# Patient Record
Sex: Male | Born: 1985 | Race: White | Hispanic: No | Marital: Single | Smoking: Never smoker
Health system: Southern US, Community
[De-identification: ages and names within clinical notes are randomized; demographics above are authoritative.]

---
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
## 2003-02-25 ENCOUNTER — Encounter: Payer: Self-pay | Admitting: Emergency Medicine

## 2003-02-25 ENCOUNTER — Encounter (INDEPENDENT_AMBULATORY_CARE_PROVIDER_SITE_OTHER): Payer: Self-pay

## 2003-02-25 ENCOUNTER — Observation Stay (HOSPITAL_COMMUNITY): Admission: EM | Admit: 2003-02-25 | Discharge: 2003-02-26 | Payer: Self-pay | Admitting: Emergency Medicine

## 2005-02-16 ENCOUNTER — Ambulatory Visit (HOSPITAL_COMMUNITY): Admission: RE | Admit: 2005-02-16 | Discharge: 2005-02-16 | Payer: Self-pay | Admitting: Orthopedic Surgery

## 2005-02-16 ENCOUNTER — Ambulatory Visit (HOSPITAL_BASED_OUTPATIENT_CLINIC_OR_DEPARTMENT_OTHER): Admission: RE | Admit: 2005-02-16 | Discharge: 2005-02-16 | Payer: Self-pay | Admitting: Orthopedic Surgery

## 2006-12-02 ENCOUNTER — Encounter: Admission: RE | Admit: 2006-12-02 | Discharge: 2006-12-02 | Payer: Self-pay | Admitting: Internal Medicine

## 2007-10-10 ENCOUNTER — Emergency Department (HOSPITAL_COMMUNITY): Admission: EM | Admit: 2007-10-10 | Discharge: 2007-10-10 | Payer: Self-pay | Admitting: Emergency Medicine

## 2008-02-19 ENCOUNTER — Emergency Department (HOSPITAL_COMMUNITY): Admission: EM | Admit: 2008-02-19 | Discharge: 2008-02-19 | Payer: Self-pay | Admitting: Emergency Medicine

## 2009-01-28 IMAGING — CR DG ELBOW COMPLETE 3+V*L*
4 series · 4 of 4 positions shown · non-contrast
Comparison: None

CLINICAL DATA: The patient fell.  Pain.

LEFT ELBOW - COMPLETE 3+ VIEW

[x elbow joint ap left]
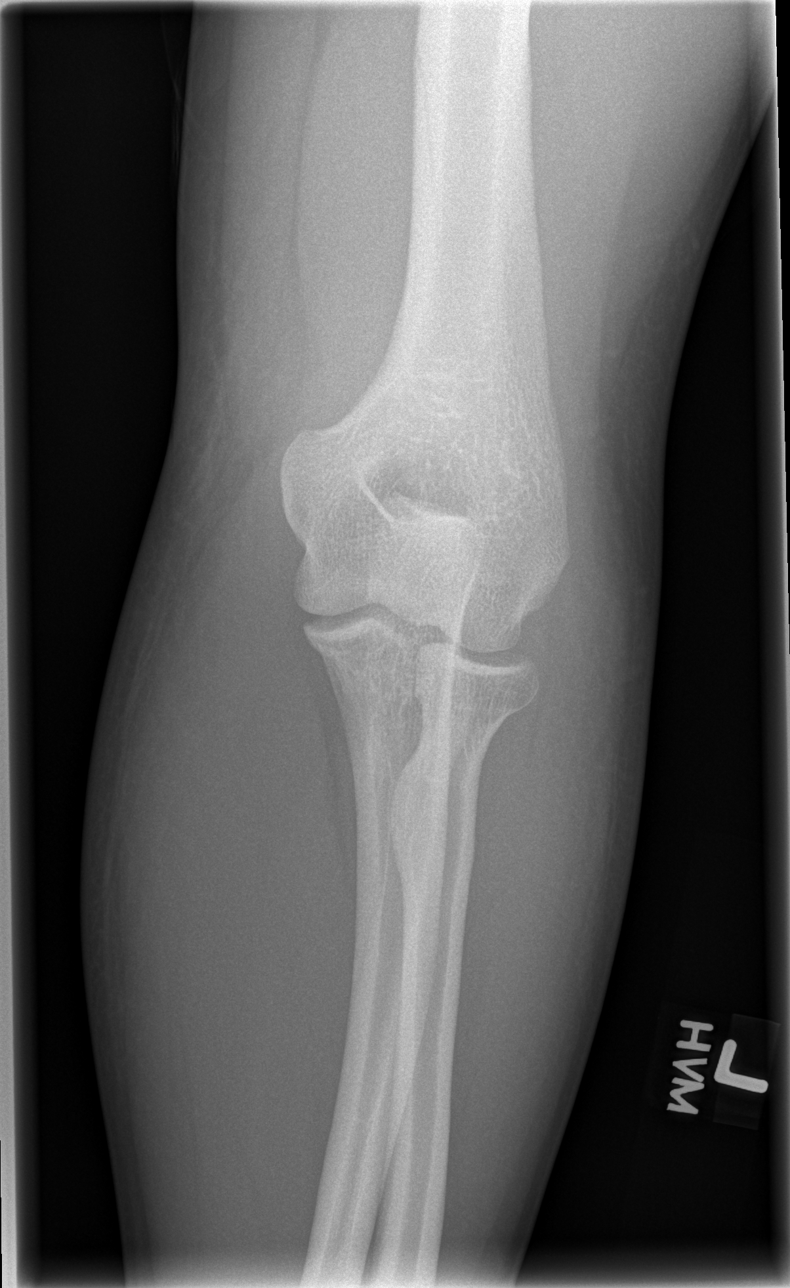

[x elbow joint obl. left (1 of 2)]
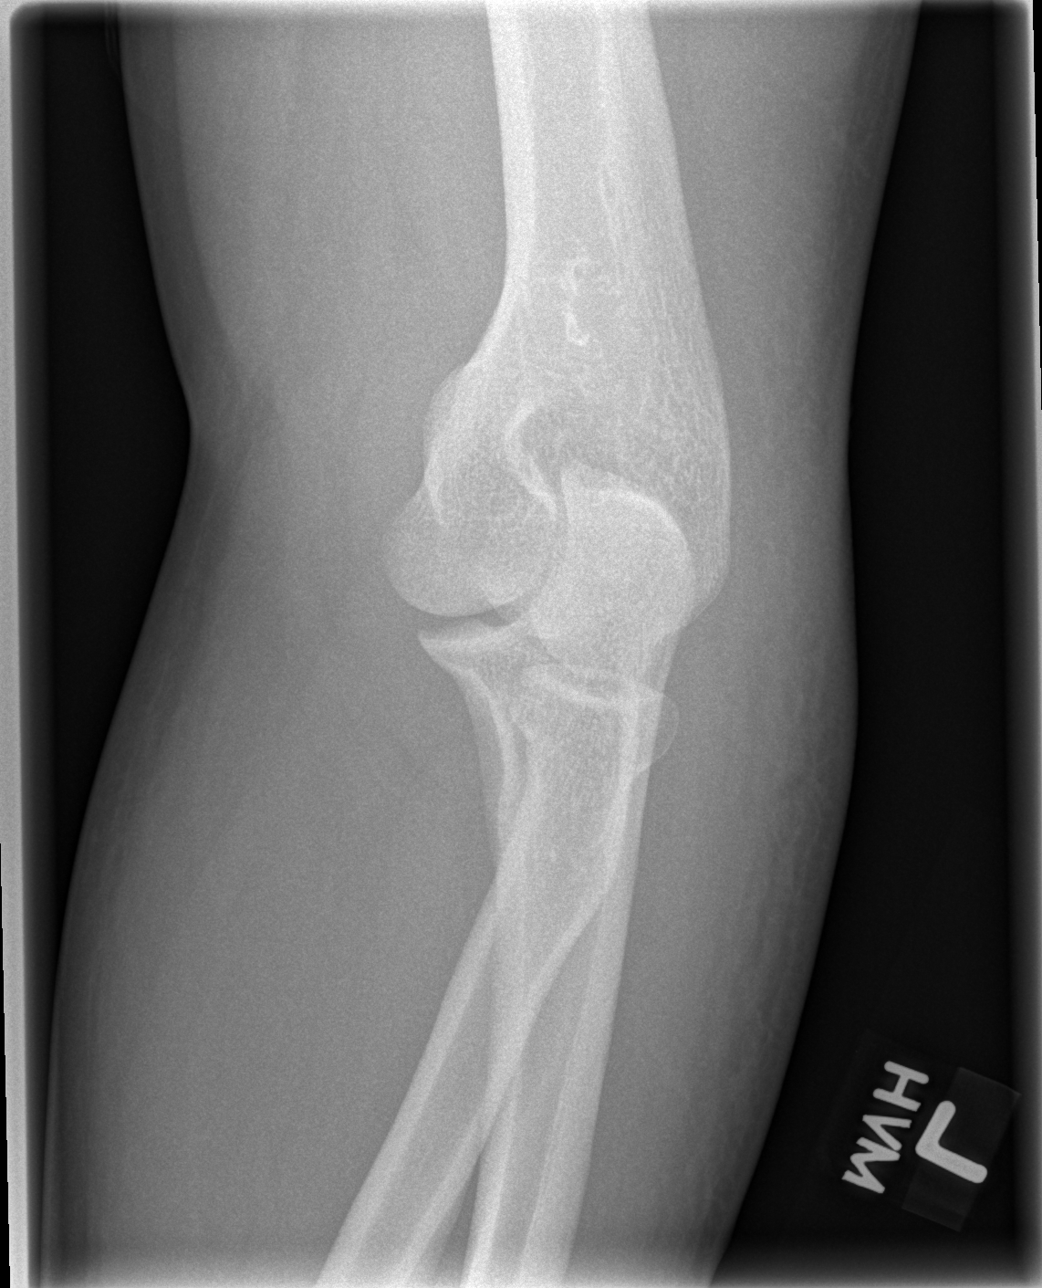

[x elbow joint obl. left (2 of 2)]
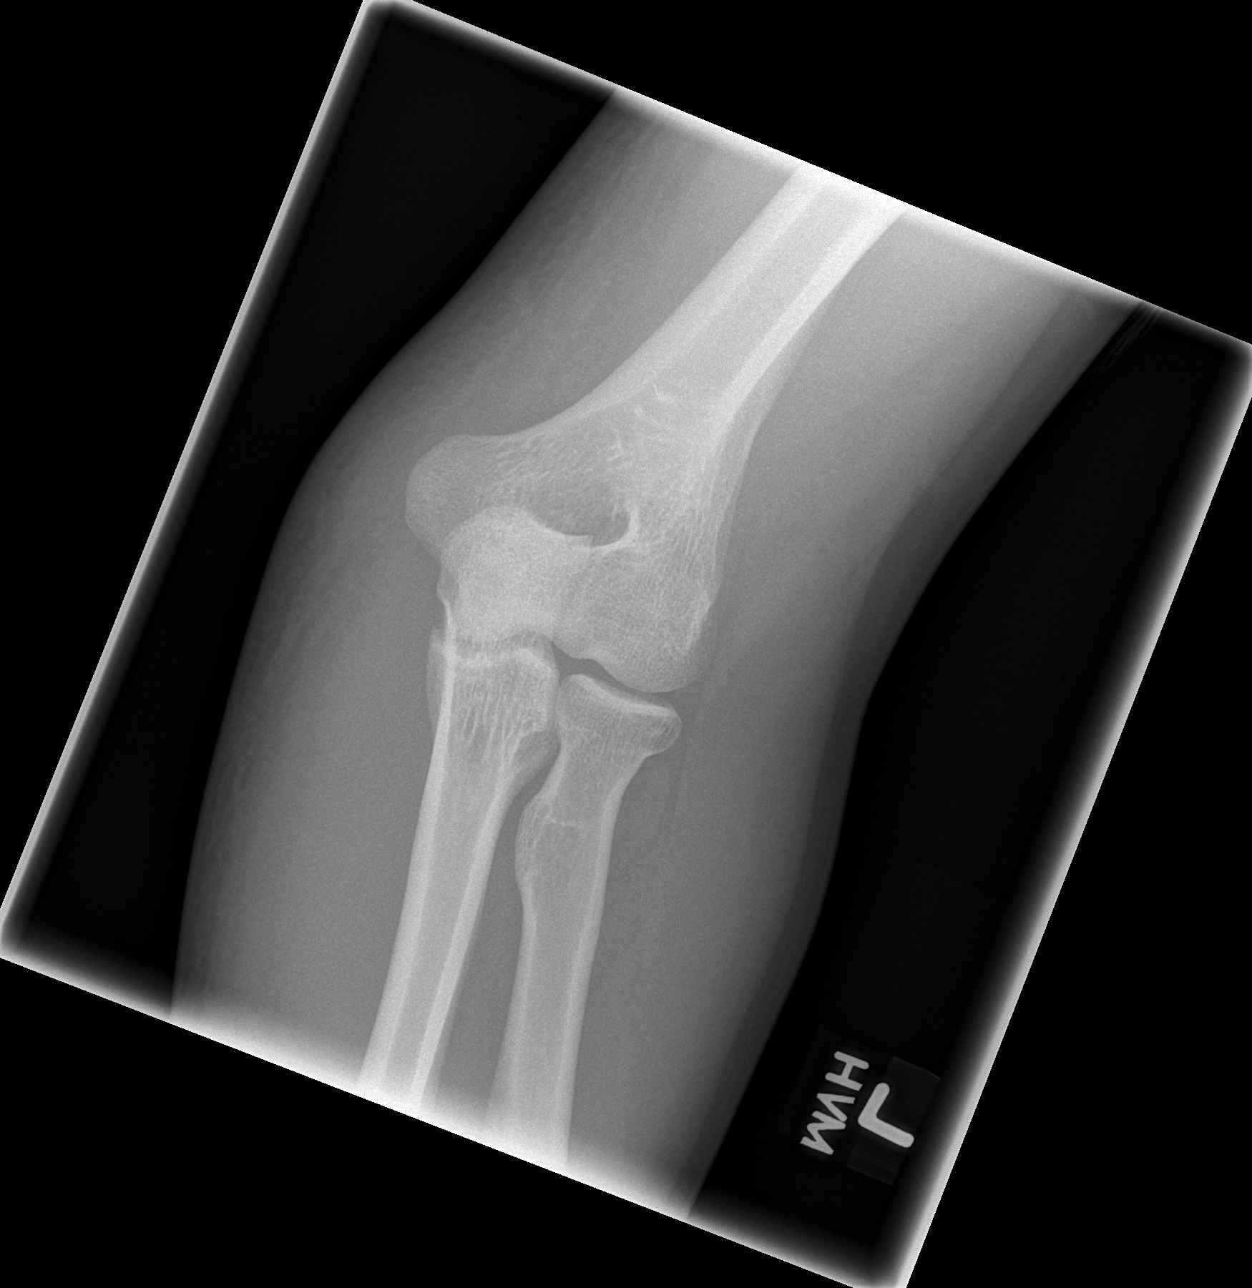

[x elbow joint lat left]
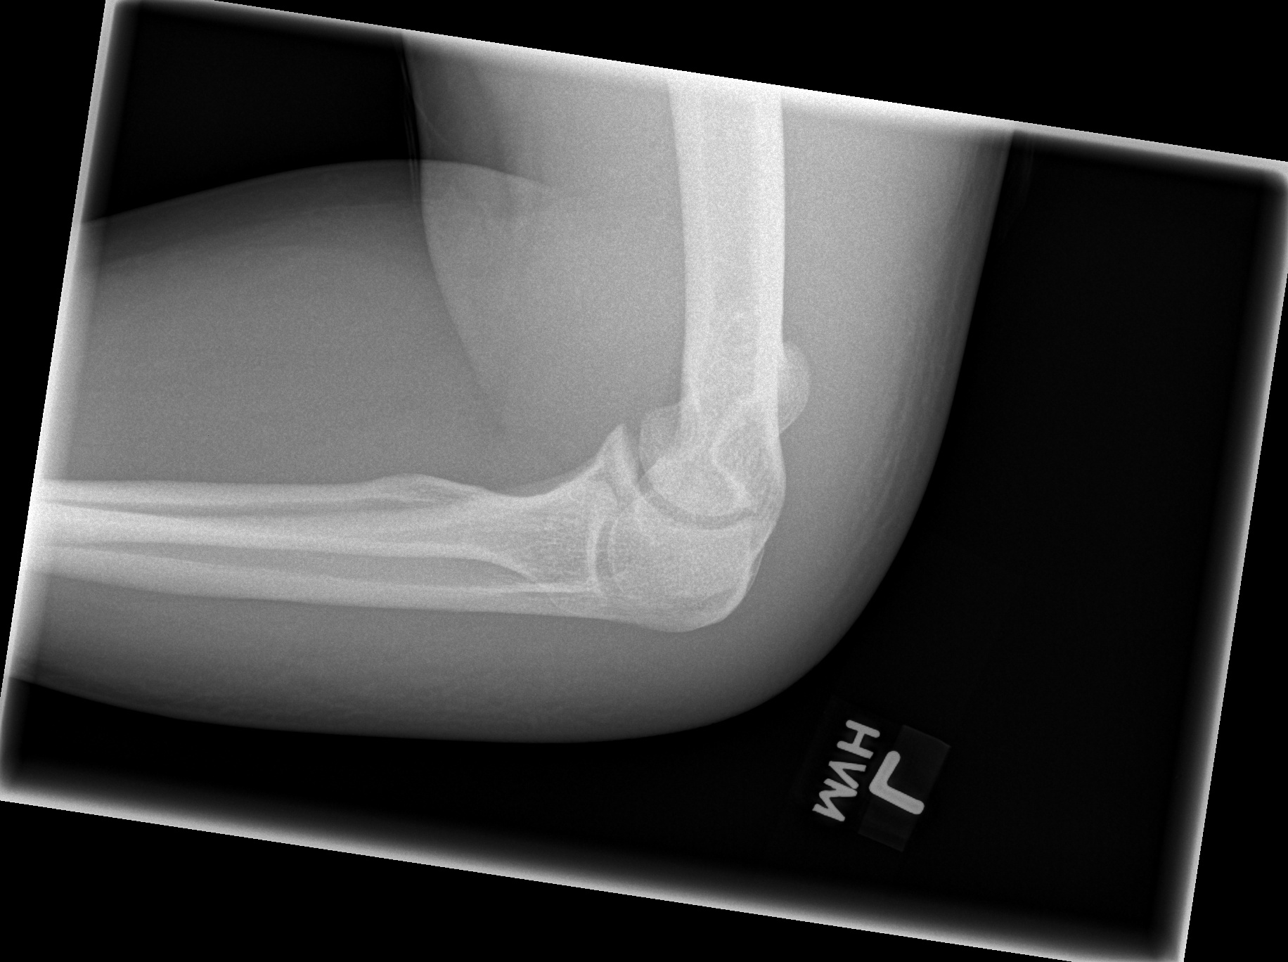

[4 of 4 positions shown; findings below may reference images not displayed]

FINDINGS: No fracture, subluxation, or joint effusion. Soft tissue
swelling particularly posteriorly.
IMPRESSION: No acute bony findings.  No joint effusion.

## 2010-12-05 NOTE — Op Note (Signed)
NAME:  Wayne Blake, Wayne Blake NO.:  [id number]   MEDICAL RECORD NO.:  [id number]          PATIENT TYPE:  AMB   LOCATION:  DSC                          FACILITY:  [facility]   PHYSICIAN:  Cindee Salt, M.D.       DATE OF BIRTH:  [birth year]   DATE OF PROCEDURE:  02/16/2005  DATE OF DISCHARGE:                                 OPERATIVE REPORT   PREOPERATIVE DIAGNOSIS:  Fracture middle phalanx, left middle finger.   POSTOPERATIVE DIAGNOSIS:  Fracture middle phalanx, left middle finger.  Open  reduction internal fixation, left middle finger middle phalanx.   SURGEON:  Kuzma.   ASSISTANT:  Carolyne Fiscal R.N.   ANESTHESIA:  Axillary block.   HISTORY:  The patient is a 25 year old male who suffered a fracture of his  left middle finger on a rope swing. He is referred from Dr. Thurston Hole. X-rays  reveal a rotated fracture of the middle phalanx, long oblique in nature with  displacement.   PROCEDURE:  The patient is brought to the operating room where an axillary  block was carried out without difficulty.  He was prepped using DuraPrep,  supine position, left arm free. A hockey-stick incision was made over the  radial aspect of the middle phalanx, carried across the distal  interphalangeal joint, carried down through subcutaneous tissue.  The  incision was made on the radial aspect to the radial side of the extensor  tendon which was then dissected free from the underlying periosteum.  Periosteum was incised on the radial aspect dorsally to allow visualization  of the fracture.  The volar soft tissue was left intact. The fracture was  long oblique, was noted to be one condyle with the remaining condyle intact.  This had some difficulty in reduction due to the traction of the extensor  for visualization of reduction, but reduction was performed and pinned with  a 0.081 K-wire. X-rays confirmed positioning.  Three screws were then placed  measuring 12, 10 and 12 mm obliquely across the fracture.   A moderate amount  of difficulty was obtained in that the 12 mm screws were not the proper head  size to accept the screwdriver and new screws had to be sterilized.  X-rays  AP lateral direction revealed the fracture well aligned. No angulation  rotation was noted with flexion-extension.  The wound was irrigated. The  periosteum closed with figure-of-eight 6-0 chromic sutures and skin with  interrupted 5-0 nylon sutures. Each of the drills were overdrilled  proximally and  countersunk to minimize head displacement. The patient tolerated the  procedure well and was taken to the recovery for observation in satisfactory  condition. He is discharged home to return to the [facility]  in one week on Vicodin.       GK/MEDQ  D:  02/16/2005  T:  02/16/2005  Job:  [id number]   cc:   Molly Maduro A. Thurston Hole, M.D.  [address]  [place] [address]  Fax: [phone]

## 2010-12-05 NOTE — Op Note (Signed)
NAME:  Wayne Blake, Wayne Blake NO.:  [id number]   MEDICAL RECORD NO.:  [id number]                   PATIENT TYPE:  OBV   LOCATION:  0103                                 FACILITY:  [facility]   PHYSICIAN:  Lorre Munroe., M.D.            DATE OF BIRTH:  1985-12-04   DATE OF PROCEDURE:  02/25/2003  DATE OF DISCHARGE:                                 OPERATIVE REPORT   PREOPERATIVE DIAGNOSIS:  Acute appendicitis.   POSTOPERATIVE DIAGNOSIS:  Acute appendicitis.   OPERATION:  Laparoscopic appendectomy.   SURGEON:  Lebron Conners, M.D.   ANESTHESIA:  General.   DESCRIPTION OF PROCEDURE:  After the patient was monitored and anesthetized  and had a bladder catheter in place and routine preparation and draping of  the abdomen, I made a 2 cm transverse incision just below the umbilicus  through a spot which I anesthetized with long-acting local anesthetic.  I  dissected down to the midline fascia and opened it in the midline  longitudinally, then bluntly entered the peritoneum.  I secured a Hasson  cannula with a 0 Vicryl pursestring suture in the fascia and inflated the  abdomen with CO2.  I put in the camera and noted inflammation in the right  lower quadrant as expected.  I then placed a 5 mm right upper quadrant port  and an 11 mm port just to the left of the lower midline under direct vision  and felt that they went in without injuring the bowel.  I then placed the  patient head-down, foot-up, and tilted to the left, and pulled the small  intestines away from the area of the cecum.  I saw an inflamed appendix,  grasped it and elevated it, and took down adhesions to the pelvic sidewalls  and to the terminal ileum and cecum.  I used cautery for this dissection.  I  thinned down the bulky mesoappendix somewhat, then attempted to staple the  appendix and meso with one firing of the stapler but it would not close  properly in that way, so I first stapled the  mesoappendix and then stapled  the appendix at its base as it joined the cecum.  It came off nicely, and  hemostasis was good.  I placed the appendix in a plastic pouch and retrieved  it through the umbilical incision and tied the pursestring suture.  I then  removed the right upper quadrant port under direct vision and removed the  lower midline port after allowing the CO2 to escape.  I closed all the skin  incisions with intracuticular 4-0 Vicryl and Steri-Strips.  The patient was  stable throughout.                                               Lorre Munroe., M.D.  WB/MEDQ  D:  02/25/2003  T:  02/26/2003  Job:  [id number]

## 2011-04-13 LAB — RAPID URINE DRUG SCREEN, HOSP PERFORMED
Amphetamines: NOT DETECTED
Barbiturates: NOT DETECTED
Benzodiazepines: NOT DETECTED
Cocaine: NOT DETECTED
Opiates: NOT DETECTED
Tetrahydrocannabinol: NOT DETECTED

## 2011-04-13 LAB — DIFFERENTIAL
Basophils Absolute: 0.1
Eosinophils Relative: 0
Lymphocytes Relative: 13
Lymphs Abs: 1.5
Neutro Abs: 10.1 — ABNORMAL HIGH

## 2011-04-13 LAB — COMPREHENSIVE METABOLIC PANEL
Alkaline Phosphatase: 61
BUN: 8
CO2: 27
Chloride: 105
Creatinine, Ser: 0.79
GFR calc non Af Amer: >60
Glucose, Bld: 104 — ABNORMAL HIGH
Potassium: 3.6
Total Bilirubin: 1.3 — ABNORMAL HIGH

## 2011-04-13 LAB — CBC
HCT: 43.6
Hemoglobin: 14.7
Platelets: 367
RDW: 12.5
WBC: 12.3 — ABNORMAL HIGH

## 2011-04-13 LAB — BASIC METABOLIC PANEL
BUN: 8
Calcium: 9.2
GFR calc non Af Amer: >60
Potassium: 3.9
Sodium: 140

## 2017-01-13 ENCOUNTER — Ambulatory Visit (INDEPENDENT_AMBULATORY_CARE_PROVIDER_SITE_OTHER): Payer: Self-pay | Admitting: Emergency Medicine

## 2017-01-13 ENCOUNTER — Encounter: Payer: Self-pay | Admitting: Emergency Medicine

## 2017-01-13 VITALS — BP 142/83 | HR 85 | Temp 98.3°F | Resp 17 | Ht 71.5 in | Wt 261.0 lb

## 2017-01-13 DIAGNOSIS — H6122 Impacted cerumen, left ear: Secondary | ICD-10-CM

## 2017-01-13 NOTE — Patient Instructions (Addendum)
   IF you received an x-ray today, you will receive an invoice from [facility]. Please contact  Radiology at [phone] with questions or concerns regarding your invoice.   IF you received labwork today, you will receive an invoice from [facility]. Please contact [facility] at [phone] with questions or concerns regarding your invoice.   Our billing staff will not be able to assist you with questions regarding bills from these companies.  You will be contacted with the lab results as soon as they are available. The fastest way to get your results is to activate your My Chart account. Instructions are located on the last page of this paperwork. If you have not heard from us regarding the results in 2 weeks, please contact this office.     Earwax Buildup, Adult The ears produce a substance called earwax that helps keep bacteria out of the ear and protects the skin in the ear canal. Occasionally, earwax can build up in the ear and cause discomfort or hearing loss. What increases the risk? This condition is more likely to develop in people who:  Are male.  Are elderly.  Naturally produce more earwax.  Clean their ears often with cotton swabs.  Use earplugs often.  Use in-ear headphones often.  Wear hearing aids.  Have narrow ear canals.  Have earwax that is overly thick or sticky.  Have eczema.  Are dehydrated.  Have excess hair in the ear canal.  What are the signs or symptoms? Symptoms of this condition include:  Reduced or muffled hearing.  A feeling of fullness in the ear or feeling that the ear is plugged.  Fluid coming from the ear.  Ear pain.  Ear itch.  Ringing in the ear.  Coughing.  An obvious piece of earwax that can be seen inside the ear canal.  How is this diagnosed? This condition may be diagnosed based on:  Your symptoms.  Your medical history.  An ear exam. During the exam, your health care provider will look  into your ear with an instrument called an otoscope.  You may have tests, including a hearing test. How is this treated? This condition may be treated by:  Using ear drops to soften the earwax.  Having the earwax removed by a health care provider. The health care provider may: ? Flush the ear with water. ? Use an instrument that has a loop on the end (curette). ? Use a suction device.  Surgery to remove the wax buildup. This may be done in severe cases.  Follow these instructions at home:  Take over-the-counter and prescription medicines only as told by your health care provider.  Do not put any objects, including cotton swabs, into your ear. You can clean the opening of your ear canal with a washcloth or facial tissue.  Follow instructions from your health care provider about cleaning your ears. Do not over-clean your ears.  Drink enough fluid to keep your urine clear or pale yellow. This will help to thin the earwax.  Keep all follow-up visits as told by your health care provider. If earwax builds up in your ears often or if you use hearing aids, consider seeing your health care provider for routine, preventive ear cleanings. Ask your health care provider how often you should schedule your cleanings.  If you have hearing aids, clean them according to instructions from the manufacturer and your health care provider. Contact a health care provider if:  You have ear pain.  You develop   a fever.  You have blood, pus, or other fluid coming from your ear.  You have hearing loss.  You have ringing in your ears that does not go away.  Your symptoms do not improve with treatment.  You feel like the room is spinning (vertigo). Summary  Earwax can build up in the ear and cause discomfort or hearing loss.  The most common symptoms of this condition include reduced or muffled hearing and a feeling of fullness in the ear or feeling that the ear is plugged.  This condition may be  diagnosed based on your symptoms, your medical history, and an ear exam.  This condition may be treated by using ear drops to soften the earwax or by having the earwax removed by a health care provider.  Do not put any objects, including cotton swabs, into your ear. You can clean the opening of your ear canal with a washcloth or facial tissue. This information is not intended to replace advice given to you by your health care provider. Make sure you discuss any questions you have with your health care provider. Document Released: 08/13/2004 Document Revised: 09/16/2016 Document Reviewed: 09/16/2016 Elsevier Interactive Patient Education  2018 [employer] Inc[employer]  

## 2017-01-13 NOTE — Progress Notes (Signed)
Wayne Blake 31 y.o.   Chief Complaint  Patient presents with  . Cerumen Impaction    HISTORY OF PRESENT ILLNESS: This is a 31 y.o. male complaining of cerumen impaction of left ear.  HPI   Prior to Admission medications   Not on File    Not on File  There are no active problems to display for this patient.   No past medical history on file.  No past surgical history on file.  Social History   Social History  . Marital status: Single    Spouse name: N/A  . Number of children: N/A  . Years of education: N/A   Occupational History  . Not on file.   Social History Main Topics  . Smoking status: Never Smoker  . Smokeless tobacco: Never Used  . Alcohol use No  . Drug use: Unknown  . Sexual activity: No   Other Topics Concern  . Not on file   Social History Narrative  . No narrative on file    No family history on file.   Review of Systems  Constitutional: Negative.  Negative for chills and fever.  HENT: Positive for hearing loss.   Respiratory: Negative.  Negative for shortness of breath.   Cardiovascular: Negative for chest pain.  Gastrointestinal: Negative for nausea and vomiting.  Skin: Negative for rash.  Neurological: Negative for dizziness and headaches.  All other systems reviewed and are negative.  Vitals:   01/13/17 1621  BP: (!) 142/83  Pulse: 85  Resp: 17  Temp: 98.3 F (36.8 C)     Physical Exam  Constitutional: He appears well-developed and well-nourished.  HENT:  Head: Normocephalic and atraumatic.  Left ear: impacted with cerumen  Eyes: EOM are normal. Pupils are equal, round, and reactive to light.  Neck: Normal range of motion.  Cardiovascular: Normal rate.   Pulmonary/Chest: Effort normal.  Neurological: He is alert.  Skin: Skin is warm and dry. Capillary refill takes less than 2 seconds.  Psychiatric: He has a normal mood and affect. His behavior is normal.  Vitals reviewed.  Ceruminosis is noted.  Wax is  removed by syringing and manual debridement. Instructions for home care to prevent wax buildup are given.   ASSESSMENT & PLAN:  Wayne Blake was seen today for cerumen impaction.  Diagnoses and all orders for this visit:  Impacted cerumen of left ear -     Ear wax removal  Hearing loss of left ear due to cerumen impaction    Patient Instructions       IF you received an x-ray today, you will receive an invoice from [facility]. Please contact [facility] at [phone] with questions or concerns regarding your invoice.   IF you received labwork today, you will receive an invoice from [facility]. Please contact [facility] at [phone] with questions or concerns regarding your invoice.   Our billing staff will not be able to assist you with questions regarding bills from these companies.  You will be contacted with the lab results as soon as they are available. The fastest way to get your results is to activate your My Chart account. Instructions are located on the last page of this paperwork. If you have not heard from us regarding the results in 2 weeks, please contact this office.      Earwax Buildup, Adult The ears produce a substance called earwax that helps keep bacteria out of the ear and protects the skin in the ear canal. Occasionally, earwax can  build up in the ear and cause discomfort or hearing loss. What increases the risk? This condition is more likely to develop in people who:  Are male.  Are elderly.  Naturally produce more earwax.  Clean their ears often with cotton swabs.  Use earplugs often.  Use in-ear headphones often.  Wear hearing aids.  Have narrow ear canals.  Have earwax that is overly thick or sticky.  Have eczema.  Are dehydrated.  Have excess hair in the ear canal.  What are the signs or symptoms? Symptoms of this condition include:  Reduced or muffled hearing.  A feeling of fullness in the ear or feeling that  the ear is plugged.  Fluid coming from the ear.  Ear pain.  Ear itch.  Ringing in the ear.  Coughing.  An obvious piece of earwax that can be seen inside the ear canal.  How is this diagnosed? This condition may be diagnosed based on:  Your symptoms.  Your medical history.  An ear exam. During the exam, your health care provider will look into your ear with an instrument called an otoscope.  You may have tests, including a hearing test. How is this treated? This condition may be treated by:  Using ear drops to soften the earwax.  Having the earwax removed by a health care provider. The health care provider may: ? Flush the ear with water. ? Use an instrument that has a loop on the end (curette). ? Use a suction device.  Surgery to remove the wax buildup. This may be done in severe cases.  Follow these instructions at home:  Take over-the-counter and prescription medicines only as told by your health care provider.  Do not put any objects, including cotton swabs, into your ear. You can clean the opening of your ear canal with a washcloth or facial tissue.  Follow instructions from your health care provider about cleaning your ears. Do not over-clean your ears.  Drink enough fluid to keep your urine clear or pale yellow. This will help to thin the earwax.  Keep all follow-up visits as told by your health care provider. If earwax builds up in your ears often or if you use hearing aids, consider seeing your health care provider for routine, preventive ear cleanings. Ask your health care provider how often you should schedule your cleanings.  If you have hearing aids, clean them according to instructions from the manufacturer and your health care provider. Contact a health care provider if:  You have ear pain.  You develop a fever.  You have blood, pus, or other fluid coming from your ear.  You have hearing loss.  You have ringing in your ears that does not go  away.  Your symptoms do not improve with treatment.  You feel like the room is spinning (vertigo). Summary  Earwax can build up in the ear and cause discomfort or hearing loss.  The most common symptoms of this condition include reduced or muffled hearing and a feeling of fullness in the ear or feeling that the ear is plugged.  This condition may be diagnosed based on your symptoms, your medical history, and an ear exam.  This condition may be treated by using ear drops to soften the earwax or by having the earwax removed by a health care provider.  Do not put any objects, including cotton swabs, into your ear. You can clean the opening of your ear canal with a washcloth or facial tissue. This information is  not intended to replace advice given to you by your health care provider. Make sure you discuss any questions you have with your health care provider. Document Released: 08/13/2004 Document Revised: 09/16/2016 Document Reviewed: 09/16/2016 Elsevier Interactive Patient Education  2018 [employer] Inc.      Edwina Barth, MD [facility]

## 2023-03-12 ENCOUNTER — Other Ambulatory Visit: Payer: Self-pay | Admitting: Internal Medicine

## 2023-03-12 ENCOUNTER — Ambulatory Visit
Admission: RE | Admit: 2023-03-12 | Discharge: 2023-03-12 | Disposition: A | Discharge status: Home / Self Care | Payer: No Typology Code available for payment source | Source: Ambulatory Visit | Attending: Internal Medicine | Admitting: Internal Medicine

## 2023-03-12 DIAGNOSIS — R059 Cough, unspecified: Secondary | ICD-10-CM
